# Patient Record
Sex: Male | Born: 2000 | Race: Black or African American | Hispanic: No | Marital: Single | Smoking: Never smoker
Health system: Southern US, Community
[De-identification: ages and names within clinical notes are randomized; demographics above are authoritative.]

## PROBLEM LIST (undated history)

## (undated) DIAGNOSIS — Z789 Other specified health status: Secondary | ICD-10-CM

## (undated) DIAGNOSIS — J302 Other seasonal allergic rhinitis: Secondary | ICD-10-CM

---
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
## 2020-02-18 DIAGNOSIS — Z1152 Encounter for screening for COVID-19: Secondary | ICD-10-CM | POA: Diagnosis not present

## 2020-08-27 IMAGING — CT CT HEAD W/O CM
4 series · 16 of 47 positions shown, 18 images · non-contrast
Comparison: 11/10/2017

CLINICAL DATA: Did a back flip on the street yesterday, fell and
struck occiput, LEFT side headache today, skull fracture

EXAM:
CT HEAD WITHOUT CONTRAST
TECHNIQUE: Contiguous axial images were obtained from the base of the skull
through the vertex without intravenous contrast. Sagittal and
coronal MPR images reconstructed from axial data set.

[Series 3: head without · axial · non-contrast · 0.47mm/px · z∈[+291,+411]mm · 7 of 33 slices shown, 9 images]
[im 5/33  brain]
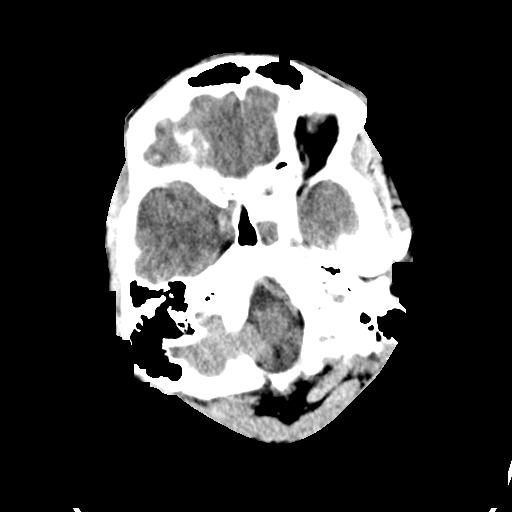
[im 5/33  bone]
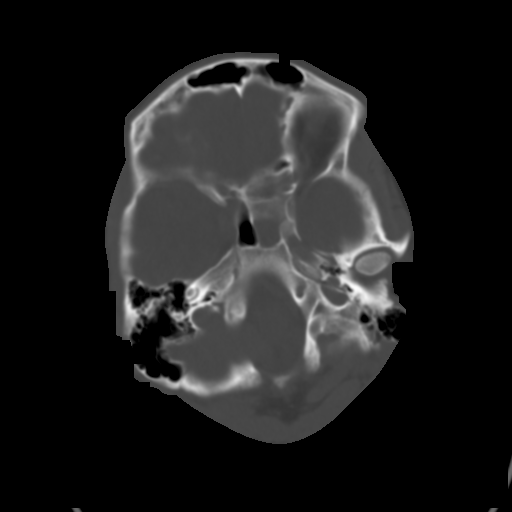
[im 9/33  brain]
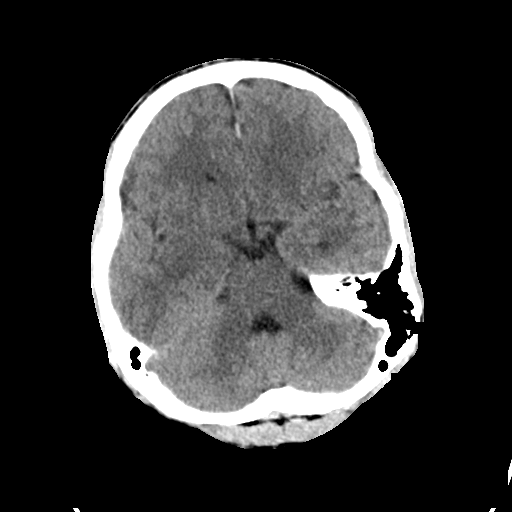
[im 13/33  brain]
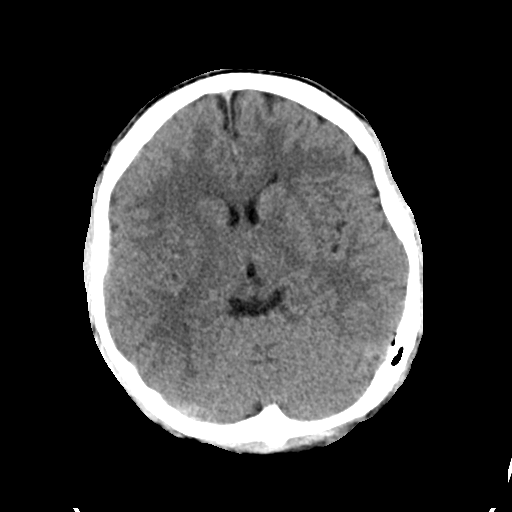
[im 17/33  brain]
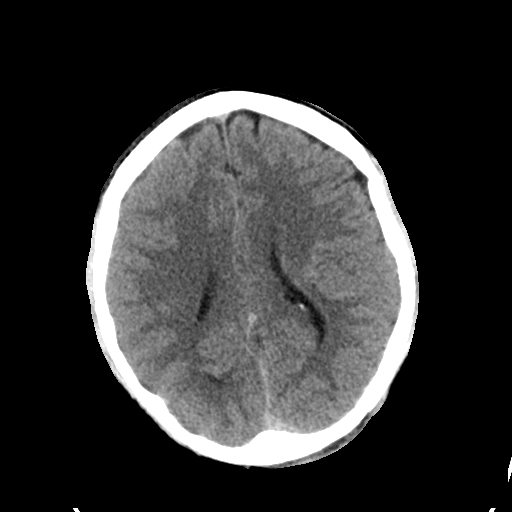
[im 21/33  brain]
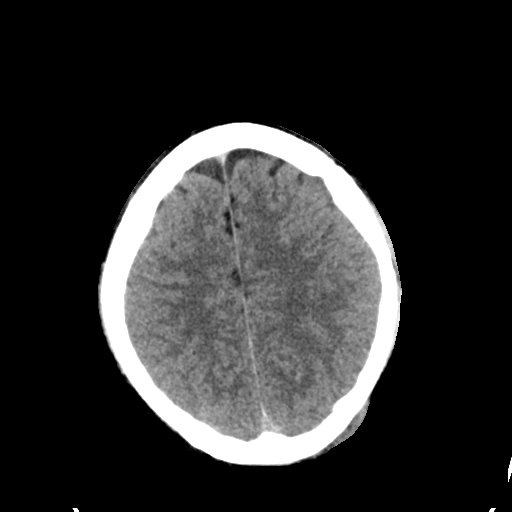
[im 21/33  bone]
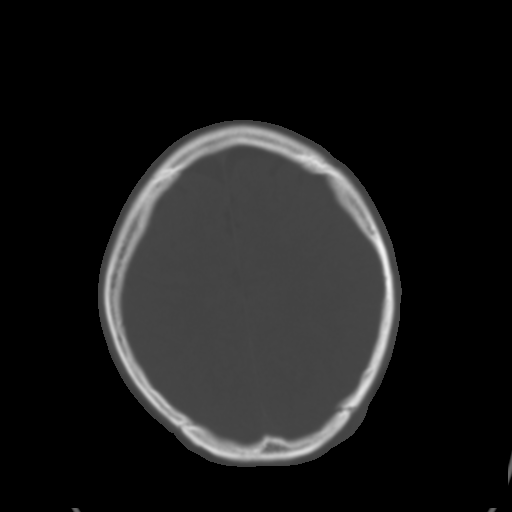
[im 25/33  brain]
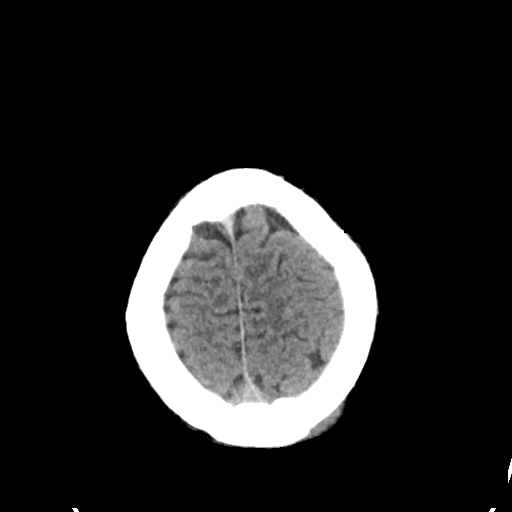
[im 29/33  brain]
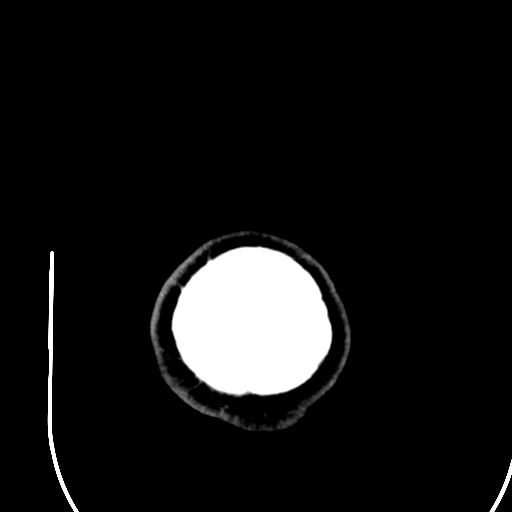

[Series 4: head bone · axial · 0.47mm/px · z∈[+287,+319]mm · 3 of 82 slices shown]
[im 9/82  bone]
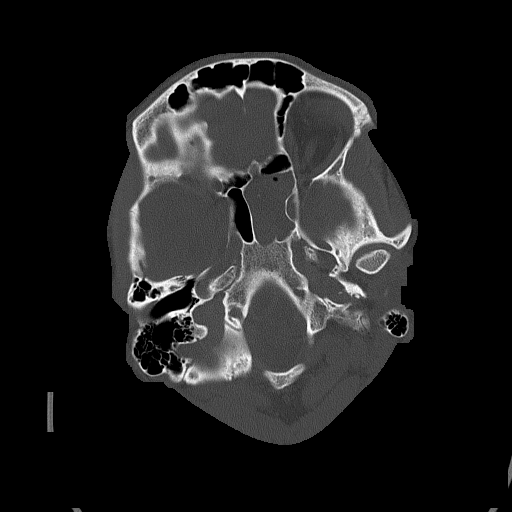
[im 17/82  bone]
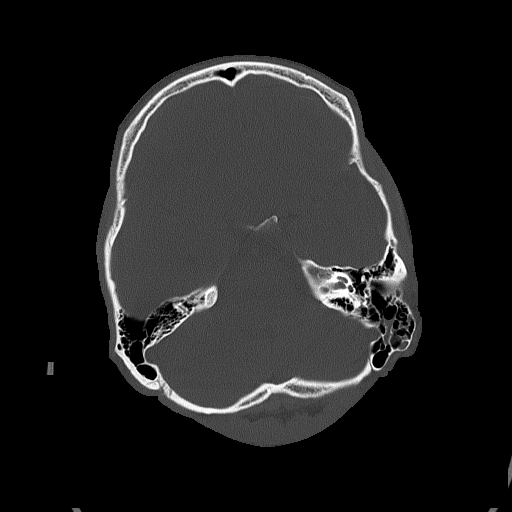
[im 25/82  bone]
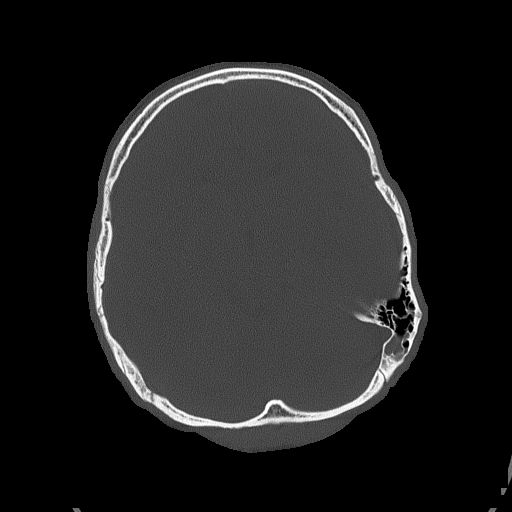

[Series 5: head without cor · coronal · non-contrast · 0.36mm/px · 3 of 77 slices shown]
[im 26/77  brain]
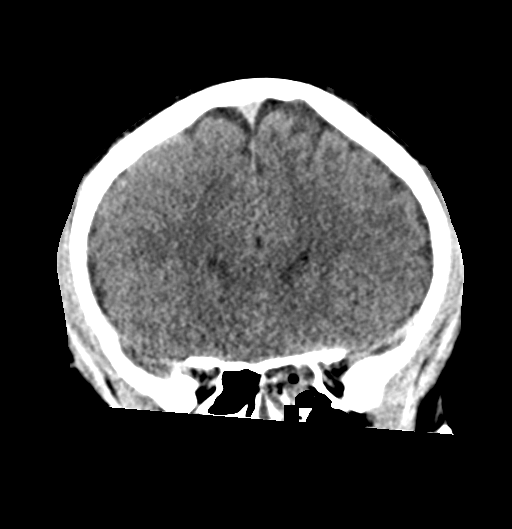
[im 34/77  brain]
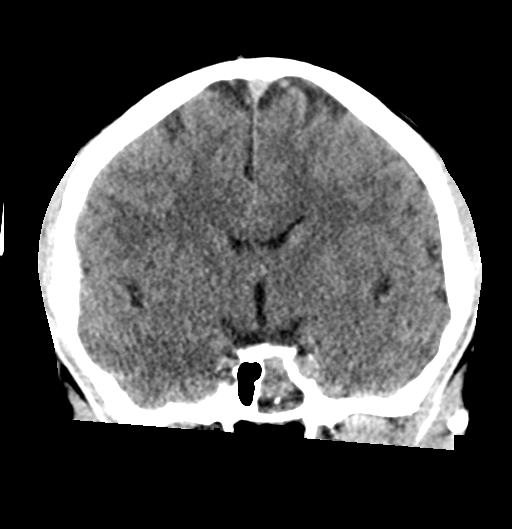
[im 43/77  brain]
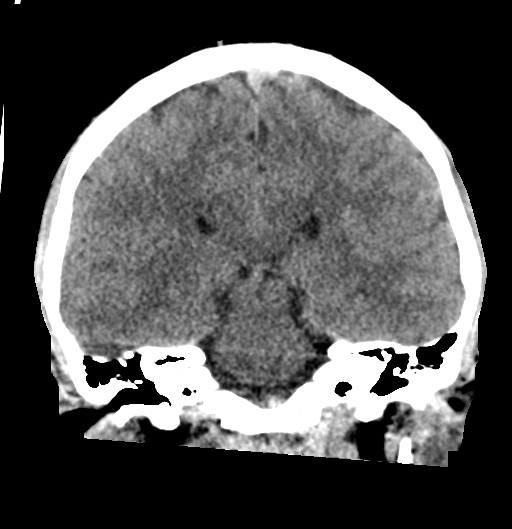

[Series 6: head without sag · sagittal · non-contrast · 0.33mm/px · 3 of 67 slices shown]
[im 23/67  brain]
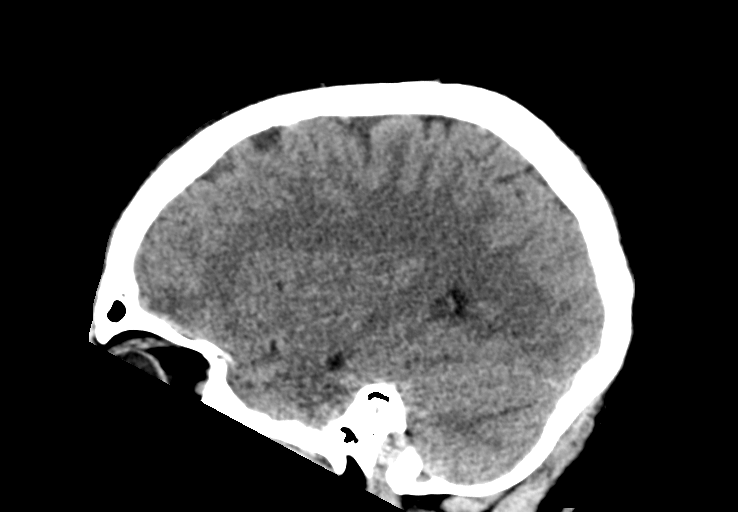
[im 34/67  brain]
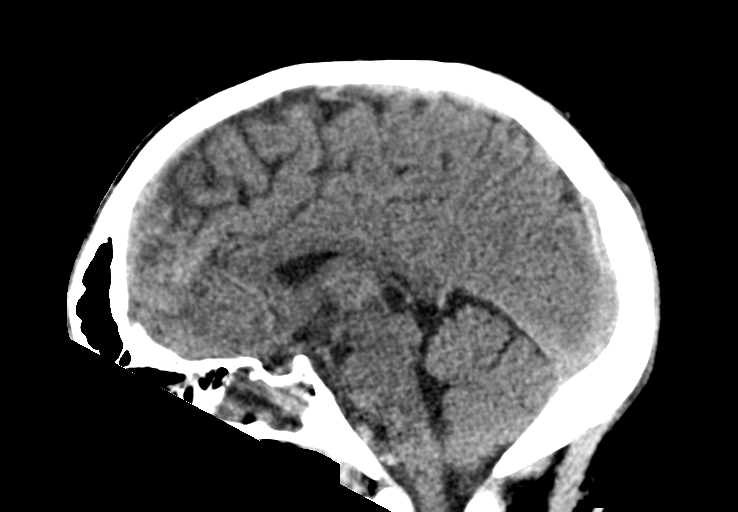
[im 45/67  brain]
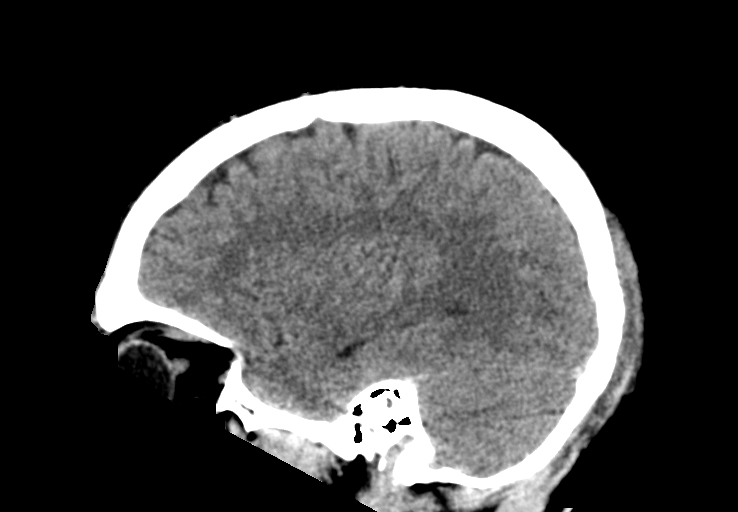

[16 of 47 positions shown; findings below may reference images not displayed]

FINDINGS: Brain: Normal ventricular morphology. No midline shift or mass
effect. Small hemorrhagic contusion at the RIGHT frontal lobe
inferiorly again identified. No additional intracranial hemorrhage
or mass lesion. Small foci of pneumocephalus at the LEFT posterior
temporal region. No definite extra-axial hemorrhage identified.

Vascular: No hyperdense vessels

Skull: LEFT temporal bone fracture traversing mastoid air cells.
Again identified LEFT occipital scalp hematoma.

Sinuses/Orbits: Partial opacification of LEFT mastoid air cells.
Persistent opacification of the LEFT sphenoid sinus. Remaining
paranasal sinuses

Other: N/A
IMPRESSION: LEFT temporal bone fracture entering LEFT mastoid air cells, which
are partially opacified.

Again identified small hemorrhagic contusion at the inferior RIGHT
frontal lobe.

Foci of pneumocephalus at the posterior LEFT temporal region without
definite associated extra-axial hemorrhage.

Persistent LEFT sphenoid sinus opacification.

## 2021-01-25 DIAGNOSIS — E559 Vitamin D deficiency, unspecified: Secondary | ICD-10-CM | POA: Diagnosis not present

## 2021-01-25 DIAGNOSIS — Z0000 Encounter for general adult medical examination without abnormal findings: Secondary | ICD-10-CM | POA: Diagnosis not present

## 2021-01-25 DIAGNOSIS — Z23 Encounter for immunization: Secondary | ICD-10-CM | POA: Diagnosis not present

## 2022-04-14 DIAGNOSIS — E559 Vitamin D deficiency, unspecified: Secondary | ICD-10-CM | POA: Diagnosis not present

## 2022-04-14 DIAGNOSIS — Z23 Encounter for immunization: Secondary | ICD-10-CM | POA: Diagnosis not present

## 2022-04-14 DIAGNOSIS — Z0000 Encounter for general adult medical examination without abnormal findings: Secondary | ICD-10-CM | POA: Diagnosis not present

## 2022-04-14 DIAGNOSIS — Z13220 Encounter for screening for lipoid disorders: Secondary | ICD-10-CM | POA: Diagnosis not present
